# Patient Record
Sex: Female | Born: 1990 | Race: White | Hispanic: No | Marital: Single | Smoking: Never smoker
Health system: Southern US, Community
[De-identification: ages and names within clinical notes are randomized; demographics above are authoritative.]

## PROBLEM LIST (undated history)

## (undated) DIAGNOSIS — T7840XA Allergy, unspecified, initial encounter: Secondary | ICD-10-CM

## (undated) HISTORY — DX: Allergy, unspecified, initial encounter: T78.40XA

---

## 2010-01-09 ENCOUNTER — Emergency Department (HOSPITAL_COMMUNITY): Admission: EM | Admit: 2010-01-09 | Discharge: 2010-01-09 | Payer: Self-pay | Admitting: Emergency Medicine

## 2010-01-27 ENCOUNTER — Ambulatory Visit: Payer: Self-pay | Admitting: Family Medicine

## 2010-05-11 LAB — URINALYSIS, ROUTINE W REFLEX MICROSCOPIC
Bilirubin Urine: NEGATIVE
Glucose, UA: NEGATIVE mg/dL
Specific Gravity, Urine: 1.024 (ref 1.005–1.030)
pH: 6.5 (ref 5.0–8.0)

## 2010-05-11 LAB — URINE CULTURE

## 2010-05-11 LAB — URINE MICROSCOPIC-ADD ON

## 2010-05-11 LAB — POCT PREGNANCY, URINE: Preg Test, Ur: NEGATIVE

## 2013-03-04 ENCOUNTER — Emergency Department (HOSPITAL_COMMUNITY)
Admission: EM | Admit: 2013-03-04 | Discharge: 2013-03-04 | Disposition: A | Discharge status: Home / Self Care | Payer: BC Managed Care – PPO | Attending: Emergency Medicine | Admitting: Emergency Medicine

## 2013-03-04 ENCOUNTER — Emergency Department (HOSPITAL_COMMUNITY): Payer: BC Managed Care – PPO

## 2013-03-04 ENCOUNTER — Encounter (HOSPITAL_COMMUNITY): Payer: Self-pay | Admitting: Emergency Medicine

## 2013-03-04 DIAGNOSIS — N390 Urinary tract infection, site not specified: Secondary | ICD-10-CM | POA: Insufficient documentation

## 2013-03-04 DIAGNOSIS — Z3202 Encounter for pregnancy test, result negative: Secondary | ICD-10-CM | POA: Insufficient documentation

## 2013-03-04 LAB — URINALYSIS, ROUTINE W REFLEX MICROSCOPIC
Bilirubin Urine: NEGATIVE
Glucose, UA: NEGATIVE mg/dL
Ketones, ur: NEGATIVE mg/dL
Leukocytes, UA: NEGATIVE
NITRITE: POSITIVE — AB
Protein, ur: 30 mg/dL — AB
SPECIFIC GRAVITY, URINE: 1.028 (ref 1.005–1.030)
UROBILINOGEN UA: 0.2 mg/dL (ref 0.0–1.0)
pH: 6 (ref 5.0–8.0)

## 2013-03-04 LAB — URINE MICROSCOPIC-ADD ON

## 2013-03-04 LAB — COMPREHENSIVE METABOLIC PANEL
ALT: 11 U/L (ref 0–35)
AST: 15 U/L (ref 0–37)
Albumin: 2.9 g/dL — ABNORMAL LOW (ref 3.5–5.2)
Alkaline Phosphatase: 65 U/L (ref 39–117)
BUN: 12 mg/dL (ref 6–23)
CALCIUM: 8.7 mg/dL (ref 8.4–10.5)
CO2: 24 mEq/L (ref 19–32)
Chloride: 102 mEq/L (ref 96–112)
Creatinine, Ser: 0.86 mg/dL (ref 0.50–1.10)
GFR calc non Af Amer: >90 mL/min (ref >90)
GLUCOSE: 90 mg/dL (ref 70–99)
Potassium: 3.9 mEq/L (ref 3.7–5.3)
SODIUM: 141 meq/L (ref 137–147)
TOTAL PROTEIN: 8.1 g/dL (ref 6.0–8.3)
Total Bilirubin: 0.3 mg/dL (ref 0.3–1.2)

## 2013-03-04 LAB — CBC WITH DIFFERENTIAL/PLATELET
BASOS PCT: 0 % (ref 0–1)
Basophils Absolute: 0 10*3/uL (ref 0.0–0.1)
EOS ABS: 0.1 10*3/uL (ref 0.0–0.7)
EOS PCT: 1 % (ref 0–5)
HCT: 28.6 % — ABNORMAL LOW (ref 36.0–46.0)
Hemoglobin: 9.8 g/dL — ABNORMAL LOW (ref 12.0–15.0)
LYMPHS ABS: 1.8 10*3/uL (ref 0.7–4.0)
Lymphocytes Relative: 15 % (ref 12–46)
MCH: 28.4 pg (ref 26.0–34.0)
MCHC: 34.3 g/dL (ref 30.0–36.0)
MCV: 82.9 fL (ref 78.0–100.0)
Monocytes Absolute: 1.2 10*3/uL — ABNORMAL HIGH (ref 0.1–1.0)
Monocytes Relative: 10 % (ref 3–12)
Neutro Abs: 8.8 10*3/uL — ABNORMAL HIGH (ref 1.7–7.7)
Neutrophils Relative %: 74 % (ref 43–77)
PLATELETS: 346 10*3/uL (ref 150–400)
RBC: 3.45 MIL/uL — AB (ref 3.87–5.11)
RDW: 12.9 % (ref 11.5–15.5)
WBC: 11.9 10*3/uL — ABNORMAL HIGH (ref 4.0–10.5)

## 2013-03-04 LAB — POCT PREGNANCY, URINE: PREG TEST UR: NEGATIVE

## 2013-03-04 LAB — LIPASE, BLOOD: LIPASE: 18 U/L (ref 11–59)

## 2013-03-04 MED ORDER — IOHEXOL 300 MG/ML  SOLN
80.0000 mL | Freq: Once | INTRAMUSCULAR | Status: AC | PRN
  Administered 2013-03-04: 80 mL via INTRAVENOUS

## 2013-03-04 MED ORDER — MORPHINE SULFATE 4 MG/ML IJ SOLN
4.0000 mg | Freq: Once | INTRAMUSCULAR | Status: AC
  Administered 2013-03-04: 4 mg via INTRAVENOUS
  Filled 2013-03-04: qty 1

## 2013-03-04 MED ORDER — CEPHALEXIN 500 MG PO CAPS: 500.0000 mg | ORAL_CAPSULE | Freq: Four times a day (QID) | ORAL | Status: DC

## 2013-03-04 MED ORDER — DEXTROSE 5 % IV SOLN
1.0000 g | Freq: Once | INTRAVENOUS | Status: AC
  Administered 2013-03-04: 1 g via INTRAVENOUS
  Filled 2013-03-04: qty 10

## 2013-03-04 MED ORDER — IBUPROFEN 800 MG PO TABS: 800.0000 mg | ORAL_TABLET | Freq: Three times a day (TID) | ORAL | Status: DC

## 2013-03-04 MED ORDER — ONDANSETRON HCL 4 MG/2ML IJ SOLN
4.0000 mg | Freq: Once | INTRAMUSCULAR | Status: AC
  Administered 2013-03-04: 4 mg via INTRAVENOUS
  Filled 2013-03-04: qty 2

## 2013-03-04 MED ORDER — IOHEXOL 300 MG/ML  SOLN
25.0000 mL | INTRAMUSCULAR | Status: AC
  Administered 2013-03-04: 25 mL via ORAL

## 2013-03-04 NOTE — ED Notes (Signed)
C/o R sided abd pain x 10 days that has been worse over the last 2 days.  Denies nausea, vomiting, diarrhea, or urinary complaint.  Pain worse with movement.

## 2013-03-04 NOTE — Discharge Instructions (Signed)
Urinary Tract Infection  Urinary tract infections (UTIs) can develop anywhere along your urinary tract. Your urinary tract is your body's drainage system for removing wastes and extra water. Your urinary tract includes two kidneys, two ureters, a bladder, and a urethra. Your kidneys are a pair of bean-shaped organs. Each kidney is about the size of your fist. They are located below your ribs, one on each side of your spine.  CAUSES  Infections are caused by microbes, which are microscopic organisms, including fungi, viruses, and bacteria. These organisms are so small that they can only be seen through a microscope. Bacteria are the microbes that most commonly cause UTIs.  SYMPTOMS   Symptoms of UTIs may vary by age and gender of the patient and by the location of the infection. Symptoms in young women typically include a frequent and intense urge to urinate and a painful, burning feeling in the bladder or urethra during urination. Older women and men are more likely to be tired, shaky, and weak and have muscle aches and abdominal pain. A fever may mean the infection is in your kidneys. Other symptoms of a kidney infection include pain in your back or sides below the ribs, nausea, and vomiting.  DIAGNOSIS  To diagnose a UTI, your caregiver will ask you about your symptoms. Your caregiver also will ask to provide a urine sample. The urine sample will be tested for bacteria and white blood cells. White blood cells are made by your body to help fight infection.  TREATMENT   Typically, UTIs can be treated with medication. Because most UTIs are caused by a bacterial infection, they usually can be treated with the use of antibiotics. The choice of antibiotic and length of treatment depend on your symptoms and the type of bacteria causing your infection.  HOME CARE INSTRUCTIONS   If you were prescribed antibiotics, take them exactly as your caregiver instructs you. Finish the medication even if you feel better after you  have only taken some of the medication.   Drink enough water and fluids to keep your urine clear or pale yellow.   Avoid caffeine, tea, and carbonated beverages. They tend to irritate your bladder.   Empty your bladder often. Avoid holding urine for long periods of time.   Empty your bladder before and after sexual intercourse.   After a bowel movement, women should cleanse from front to back. Use each tissue only once.  SEEK MEDICAL CARE IF:    You have back pain.   You develop a fever.   Your symptoms do not begin to resolve within 3 days.  SEEK IMMEDIATE MEDICAL CARE IF:    You have severe back pain or lower abdominal pain.   You develop chills.   You have nausea or vomiting.   You have continued burning or discomfort with urination.  MAKE SURE YOU:    Understand these instructions.   Will watch your condition.   Will get help right away if you are not doing well or get worse.  Document Released: 11/24/2004 Document Revised: 08/16/2011 Document Reviewed: 03/25/2011  [employer] Patient Information 2014 [employer].

## 2013-03-04 NOTE — ED Provider Notes (Signed)
CSN: [id number]     Arrival date & time 03/04/13  0321 History   First MD Initiated Contact with Patient 03/04/13 [phone]     Chief Complaint  Patient presents with  . Abdominal Pain   (Consider location/radiation/quality/duration/timing/severity/associated sxs/prior Treatment) HPI Comments: Patient presents to the emergency department with chief complaints of abdominal pain. She states the pain is mostly located on the right side. States the pain began approximately 10 days ago, and has recently worsened over the past 2 days. She states that pain is worsened with movement, and better with rest. She denies fevers, chills, nausea, vomiting, diarrhea, constipation, dysuria, hematuria, or unusual vaginal discharge. Patient states that she is currently on her menstrual cycle. He denies any history of abdominal surgeries. She is not tried taking anything to alleviate her symptoms.  The history is provided by the patient. No language interpreter was used.    History reviewed. No pertinent past medical history. History reviewed. No pertinent past surgical history. No family history on file. History  Substance Use Topics  . Smoking status: Never Smoker   . Smokeless tobacco: Not on file  . Alcohol Use: No   OB History   Grav Para Term Preterm Abortions TAB SAB Ect Mult Living                 Review of Systems  All other systems reviewed and are negative.    Allergies  Other  Home Medications   Current Outpatient Rx  Name  Route  Sig  Dispense  Refill  . ibuprofen (ADVIL,MOTRIN) 800 MG tablet   Oral   Take 800 mg by mouth every 8 (eight) hours as needed for moderate pain.          BP 107/57  Pulse 89  Temp(Src) 97.7 F (36.5 C) (Oral)  Resp 20  SpO2 98%  LMP 02/28/2013 Physical Exam  Nursing note and vitals reviewed. Constitutional: She is oriented to person, place, and time. She appears well-developed and well-nourished.  HENT:  Head: Normocephalic and atraumatic.  Eyes:  Conjunctivae and EOM are normal. Pupils are equal, round, and reactive to light.  Neck: Normal range of motion. Neck supple.  Cardiovascular: Normal rate and regular rhythm.  Exam reveals no gallop and no friction rub.   No murmur heard. Pulmonary/Chest: Effort normal and breath sounds normal. No respiratory distress. She has no wheezes. She has no rales. She exhibits no tenderness.  Abdominal: Soft. She exhibits no distension and no mass. There is tenderness. There is no rebound and no guarding.  Patient is very tender in the right lower quadrant, she also exhibits right lower quadrant tenderness on the left lower quadrant is palpated  Musculoskeletal: Normal range of motion. She exhibits no edema and no tenderness.  Neurological: She is alert and oriented to person, place, and time.  Skin: Skin is warm and dry.  Psychiatric: She has a normal mood and affect. Her behavior is normal. Judgment and thought content normal.    ED Course  Procedures (including critical care time) Results for orders placed during the hospital encounter of 03/04/13  CBC WITH DIFFERENTIAL      Result Value Range   WBC 11.9 (*) 4.0 - 10.5 K/uL   RBC 3.45 (*) 3.87 - 5.11 MIL/uL   Hemoglobin 9.8 (*) 12.0 - 15.0 g/dL   HCT 11.9 (*) 14.7 - 82.9 %   MCV 82.9  78.0 - 100.0 fL   MCH 28.4  26.0 - 34.0 pg  MCHC 34.3  30.0 - 36.0 g/dL   RDW 16.1  09.6 - 04.5 %   Platelets 346  150 - 400 K/uL   Neutrophils Relative % 74  43 - 77 %   Neutro Abs 8.8 (*) 1.7 - 7.7 K/uL   Lymphocytes Relative 15  12 - 46 %   Lymphs Abs 1.8  0.7 - 4.0 K/uL   Monocytes Relative 10  3 - 12 %   Monocytes Absolute 1.2 (*) 0.1 - 1.0 K/uL   Eosinophils Relative 1  0 - 5 %   Eosinophils Absolute 0.1  0.0 - 0.7 K/uL   Basophils Relative 0  0 - 1 %   Basophils Absolute 0.0  0.0 - 0.1 K/uL  COMPREHENSIVE METABOLIC PANEL      Result Value Range   Sodium 141  137 - 147 mEq/L   Potassium 3.9  3.7 - 5.3 mEq/L   Chloride 102  96 - 112 mEq/L   CO2  24  19 - 32 mEq/L   Glucose, Bld 90  70 - 99 mg/dL   BUN 12  6 - 23 mg/dL   Creatinine, Ser 4.09  0.50 - 1.10 mg/dL   Calcium 8.7  8.4 - 81.1 mg/dL   Total Protein 8.1  6.0 - 8.3 g/dL   Albumin 2.9 (*) 3.5 - 5.2 g/dL   AST 15  0 - 37 U/L   ALT 11  0 - 35 U/L   Alkaline Phosphatase 65  39 - 117 U/L   Total Bilirubin 0.3  0.3 - 1.2 mg/dL   GFR calc non Af Amer >90  >90 mL/min   GFR calc Af Amer >90  >90 mL/min  LIPASE, BLOOD      Result Value Range   Lipase 18  11 - 59 U/L  URINALYSIS, ROUTINE W REFLEX MICROSCOPIC      Result Value Range   Color, Urine YELLOW  YELLOW   APPearance CLOUDY (*) CLEAR   Specific Gravity, Urine 1.028  1.005 - 1.030   pH 6.0  5.0 - 8.0   Glucose, UA NEGATIVE  NEGATIVE mg/dL   Hgb urine dipstick MODERATE (*) NEGATIVE   Bilirubin Urine NEGATIVE  NEGATIVE   Ketones, ur NEGATIVE  NEGATIVE mg/dL   Protein, ur 30 (*) NEGATIVE mg/dL   Urobilinogen, UA 0.2  0.0 - 1.0 mg/dL   Nitrite POSITIVE (*) NEGATIVE   Leukocytes, UA NEGATIVE  NEGATIVE  URINE MICROSCOPIC-ADD ON      Result Value Range   Squamous Epithelial / LPF MANY (*) RARE   WBC, UA 3-6  <3 WBC/hpf   RBC / HPF 3-6  <3 RBC/hpf   Bacteria, UA MANY (*) RARE   Casts GRANULAR CAST (*) NEGATIVE  POCT PREGNANCY, URINE      Result Value Range   Preg Test, Ur NEGATIVE  NEGATIVE   Ct Abdomen Pelvis W Contrast  03/04/2013   CLINICAL DATA:  Right lower quadrant abdominal pain and tenderness.  EXAM: CT ABDOMEN AND PELVIS WITH CONTRAST  TECHNIQUE: Multidetector CT imaging of the abdomen and pelvis was performed using the standard protocol following bolus administration of intravenous contrast.  CONTRAST:  80mL OMNIPAQUE IOHEXOL 300 MG/ML  SOLN  COMPARISON:  None.  FINDINGS: It is difficult to identify a discrete appendix due to crowding of bowel and lack of intraperitoneal fat. There is no convincing evidence by CT of acute appendicitis. There are some thickened appearing loops of jejunum in the left abdomen as well  as some potential adjacent edema in the mesenteric fat. Findings may be consistent with enteritis. There is no evidence of overt bowel obstruction or perforation. No focal abscess is identified.  In the pelvis, a small amount of free fluid is present as well as an ill-defined enhancing cystic structure in the right pelvis which likely represents an ovarian cyst. Based on morphology, this may be a collapsing/ ruptured cyst. This region measures roughly 2 cm in diameter.  The liver, spleen, gallbladder, pancreas, adrenal glands and kidneys are within normal limits. No enlarged lymph nodes are identified. No evidence of hernia. The bladder is unremarkable. No vascular abnormalities are seen. Bony structures are within normal limits.  IMPRESSION: 1. No evidence of acute appendicitis by CT. The appendix is difficult to delineate due to crowding of bowel loops related to lack of intraperitoneal fat. 2. Suggestion of thickened jejunal loops without obstruction, bowel perforation or focal abscess. Findings may be consistent with enteritis. 3. 2 cm irregular cystic area of the right pelvis which may represent a collapsing ovarian cyst.   Electronically Signed   By: Irish LackGlenn  Yamagata M.D.   On: 03/04/2013 11:46      EKG Interpretation   None       MDM   1. UTI (lower urinary tract infection)     Patient with right lower quadrant abdominal pain. The pain is severe. She denies any nausea, vomiting, diarrhea, fevers, or chills. She also denies any dysuria or hematuria. Prilosec she does have a UTI today, and I will treat her with Rocephin, however do to the right lower quadrant tenderness, which is unrelenting, and has been worsening over the past couple of days, I cannot rule out appendicitis, therefore I will order a CT abdomen pelvis for further evaluation. Patient denies pelvic pain, or any vaginal discharge, other than her menstrual cycle.  11:58 AM CT is negative. Patient continues to deny any pelvic pain.  She states she feels a little better. Will discharge with Keflex, ibuprofen. Patient is stable and ready for discharge.    Roxy Horsemanobert Adlene Adduci, PA-C 03/04/13 1158

## 2013-03-05 LAB — URINE CULTURE: Colony Count: >=100000

## 2013-03-05 NOTE — ED Provider Notes (Signed)
Medical screening examination/treatment/procedure(s) were performed by non-physician practitioner and as supervising physician I was immediately available for consultation/collaboration.  Flint MelterElliott L Hibah Odonnell, MD 03/05/13 [phone]

## 2014-07-17 ENCOUNTER — Ambulatory Visit (INDEPENDENT_AMBULATORY_CARE_PROVIDER_SITE_OTHER): Payer: BLUE CROSS/BLUE SHIELD | Admitting: Emergency Medicine

## 2014-07-17 VITALS — BP 110/60 | HR 60 | Temp 98.0°F | Resp 16 | Ht 65.0 in | Wt 116.0 lb

## 2014-07-17 DIAGNOSIS — N760 Acute vaginitis: Secondary | ICD-10-CM

## 2014-07-17 DIAGNOSIS — B353 Tinea pedis: Secondary | ICD-10-CM | POA: Diagnosis not present

## 2014-07-17 DIAGNOSIS — B9689 Other specified bacterial agents as the cause of diseases classified elsewhere: Secondary | ICD-10-CM

## 2014-07-17 DIAGNOSIS — A499 Bacterial infection, unspecified: Secondary | ICD-10-CM

## 2014-07-17 DIAGNOSIS — R300 Dysuria: Secondary | ICD-10-CM | POA: Diagnosis not present

## 2014-07-17 LAB — POCT WET PREP WITH KOH
KOH Prep POC: NEGATIVE
TRICHOMONAS UA: NEGATIVE
YEAST WET PREP PER HPF POC: NEGATIVE

## 2014-07-17 LAB — POCT UA - MICROSCOPIC ONLY
CRYSTALS, UR, HPF, POC: NEGATIVE
Casts, Ur, LPF, POC: NEGATIVE
MUCUS UA: NEGATIVE
Yeast, UA: NEGATIVE

## 2014-07-17 LAB — POCT URINALYSIS DIPSTICK
Bilirubin, UA: NEGATIVE
GLUCOSE UA: NEGATIVE
Ketones, UA: NEGATIVE
Leukocytes, UA: NEGATIVE
NITRITE UA: NEGATIVE
PROTEIN UA: 100
Spec Grav, UA: 1.02
UROBILINOGEN UA: 1
pH, UA: 7.5

## 2014-07-17 MED ORDER — TERBINAFINE HCL 250 MG PO TABS: 250.0000 mg | ORAL_TABLET | Freq: Every day | ORAL | Status: DC

## 2014-07-17 MED ORDER — METRONIDAZOLE 500 MG PO TABS: 500.0000 mg | ORAL_TABLET | Freq: Two times a day (BID) | ORAL | Status: DC

## 2014-07-17 NOTE — Progress Notes (Signed)
Subjective:  Patient ID: Katrina Hudson, female    DOB: Dec 03, 1990  Age: 24 y.o. MRN: [id number]  CC: Blister and Urine Exam   HPI Katrina SaunasDanya S Hudson presents for evaluation and treatment of a rash on her foot. She noted about 2 weeks ago rash on the medial aspect of her left foot just below the ankle. She said it began as blisters and then developed into a scaly rash that is pruritic. She now has multiple lesions on her forefoot between her toes.  She describes a discomfort in her genital region she said she had one episode of dyspareunia but now has itching and burning. She has scant vaginal discharge. Denies any dysuria, urgency or frequency. Has no fever or chills. No nausea vomiting. No change.  She denies any improvement with over-the-counter medication and has no new medical problem  History Katrina Hudson has a past medical history of Allergy.   She has no past surgical history on file.   Her family history includes Cancer in her sister; Diabetes in her brother; Hypertension in her father, mother, and sister; Stroke in her sister.She reports that she has never smoked. She does not have any smokeless tobacco history on file. She reports that she does not drink alcohol or use illicit drugs.  Outpatient Prescriptions Prior to Visit  Medication Sig Dispense Refill  . cephALEXin (KEFLEX) 500 MG capsule Take 1 capsule (500 mg total) by mouth 4 (four) times daily. (Patient not taking: Reported on 07/17/2014) 40 capsule 0  . ibuprofen (ADVIL,MOTRIN) 800 MG tablet Take 800 mg by mouth every 8 (eight) hours as needed for moderate pain.    Katrina Hudson. ibuprofen (ADVIL,MOTRIN) 800 MG tablet Take 1 tablet (800 mg total) by mouth 3 (three) times daily. (Patient not taking: Reported on 07/17/2014) 21 tablet 0   No facility-administered medications prior to visit.    ROS Review of Systems  Constitutional: Negative for fever, chills and appetite change.  HENT: Negative for congestion, ear pain, postnasal drip,  sinus pressure and sore throat.   Eyes: Negative for pain and redness.  Respiratory: Negative for cough, shortness of breath and wheezing.   Cardiovascular: Negative for leg swelling.  Gastrointestinal: Negative for nausea, vomiting, abdominal pain, diarrhea, constipation and blood in stool.  Endocrine: Negative for polyuria.  Genitourinary: Positive for dysuria and vaginal discharge. Negative for urgency, frequency and flank pain.  Musculoskeletal: Negative for gait problem.  Skin: Positive for rash.  Neurological: Negative for weakness and headaches.  Psychiatric/Behavioral: Negative for confusion and decreased concentration. The patient is not nervous/anxious.     Objective:  BP 110/60 mmHg  Pulse 60  Temp(Src) 98 F (36.7 C) (Oral)  Resp 16  Ht 5\' 5"  (1.651 m)  Wt 116 lb (52.617 kg)  BMI 19.30 kg/m2  SpO2 98%  LMP 07/09/2014  Physical Exam  Constitutional: She is oriented to person, place, and time. She appears well-developed and well-nourished. No distress.  HENT:  Head: Normocephalic and atraumatic.  Right Ear: External ear normal.  Left Ear: External ear normal.  Nose: Nose normal.  Eyes: Conjunctivae and EOM are normal. Pupils are equal, round, and reactive to light. No scleral icterus.  Neck: Normal range of motion. Neck supple. No tracheal deviation present.  Cardiovascular: Normal rate, regular rhythm and normal heart sounds.   Pulmonary/Chest: Effort normal. No respiratory distress. She has no wheezes. She has no rales.  Abdominal: She exhibits no mass. There is no tenderness. There is no rebound and no guarding.  Musculoskeletal: She exhibits no edema.  Lymphadenopathy:    She has no cervical adenopathy.  Neurological: She is alert and oriented to person, place, and time. Coordination normal.  Skin: Skin is warm and dry. Rash (on left foot suggestive of a tinea pedis) noted.  Psychiatric: She has a normal mood and affect. Her behavior is normal.       Assessment & Plan:   Craig StaggersDanya was seen today for blister and urine exam.  Diagnoses and all orders for this visit:  Tinea pedis of left foot Orders: -     POCT UA - Microscopic Only -     POCT urinalysis dipstick -     GC/Chlamydia Probe Amp -     POCT Wet Prep with KOH  Dysuria Orders: -     POCT UA - Microscopic Only -     POCT urinalysis dipstick -     GC/Chlamydia Probe Amp -     POCT Wet Prep with KOH  BV (bacterial vaginosis)  Other orders -     terbinafine (LAMISIL) 250 MG tablet; Take 1 tablet (250 mg total) by mouth daily. -     metroNIDAZOLE (FLAGYL) 500 MG tablet; Take 1 tablet (500 mg total) by mouth 2 (two) times daily with a meal. DO NOT CONSUME ALCOHOL WHILE TAKING THIS MEDICATION.   I am having Ms. Kemler start on terbinafine and metroNIDAZOLE. I am also having her maintain her ibuprofen, cephALEXin, and ibuprofen.  Meds ordered this encounter  Medications  . terbinafine (LAMISIL) 250 MG tablet    Sig: Take 1 tablet (250 mg total) by mouth daily.    Dispense:  14 tablet    Refill:  0  . metroNIDAZOLE (FLAGYL) 500 MG tablet    Sig: Take 1 tablet (500 mg total) by mouth 2 (two) times daily with a meal. DO NOT CONSUME ALCOHOL WHILE TAKING THIS MEDICATION.    Dispense:  14 tablet    Refill:  0   Results for orders placed or performed in visit on 07/17/14  POCT UA - Microscopic Only  Result Value Ref Range   WBC, Ur, HPF, POC 0-4    RBC, urine, microscopic 0-2    Bacteria, U Microscopic mod    Mucus, UA neg    Epithelial cells, urine per micros 0-3    Crystals, Ur, HPF, POC neg    Casts, Ur, LPF, POC neg    Yeast, UA neg   POCT urinalysis dipstick  Result Value Ref Range   Color, UA yellow    Clarity, UA clear    Glucose, UA neg    Bilirubin, UA neg    Ketones, UA neg    Spec Grav, UA 1.020    Blood, UA trace-intact    pH, UA 7.5    Protein, UA 100    Urobilinogen, UA 1.0    Nitrite, UA neg    Leukocytes, UA Negative   POCT Wet Prep  with KOH  Result Value Ref Range   Trichomonas, UA Negative    Clue Cells Wet Prep HPF POC 2-6    Epithelial Wet Prep HPF POC 0-2    Yeast Wet Prep HPF POC neg    Bacteria Wet Prep HPF POC mod    RBC Wet Prep HPF POC 0-1    WBC Wet Prep HPF POC 0-3    KOH Prep POC Negative      Follow-up: Return in about 2 weeks (around 07/31/2014).  Carmelina DaneAnderson, Kresta Templeman S, MD

## 2014-07-17 NOTE — Patient Instructions (Signed)

## 2014-07-19 LAB — GC/CHLAMYDIA PROBE AMP
CT Probe RNA: NEGATIVE
GC Probe RNA: NEGATIVE

## 2014-12-22 ENCOUNTER — Emergency Department (HOSPITAL_COMMUNITY)
Admission: EM | Admit: 2014-12-22 | Discharge: 2014-12-22 | Disposition: A | Discharge status: Home / Self Care | Payer: Self-pay | Attending: Emergency Medicine | Admitting: Emergency Medicine

## 2014-12-22 ENCOUNTER — Encounter (HOSPITAL_COMMUNITY): Payer: Self-pay | Admitting: Cardiology

## 2014-12-22 ENCOUNTER — Emergency Department (HOSPITAL_COMMUNITY): Payer: Self-pay

## 2014-12-22 DIAGNOSIS — R109 Unspecified abdominal pain: Secondary | ICD-10-CM

## 2014-12-22 DIAGNOSIS — Z3202 Encounter for pregnancy test, result negative: Secondary | ICD-10-CM | POA: Insufficient documentation

## 2014-12-22 DIAGNOSIS — N200 Calculus of kidney: Secondary | ICD-10-CM | POA: Insufficient documentation

## 2014-12-22 LAB — URINE MICROSCOPIC-ADD ON

## 2014-12-22 LAB — URINALYSIS, ROUTINE W REFLEX MICROSCOPIC
BILIRUBIN URINE: NEGATIVE
Glucose, UA: NEGATIVE mg/dL
KETONES UR: 15 mg/dL — AB
NITRITE: NEGATIVE
Protein, ur: NEGATIVE mg/dL
SPECIFIC GRAVITY, URINE: 1.025 (ref 1.005–1.030)
UROBILINOGEN UA: 0.2 mg/dL (ref 0.0–1.0)
pH: 7.5 (ref 5.0–8.0)

## 2014-12-22 LAB — COMPREHENSIVE METABOLIC PANEL
ALT: 9 U/L — ABNORMAL LOW (ref 14–54)
ANION GAP: 10 (ref 5–15)
AST: 25 U/L (ref 15–41)
Albumin: 4 g/dL (ref 3.5–5.0)
Alkaline Phosphatase: 42 U/L (ref 38–126)
BILIRUBIN TOTAL: 0.6 mg/dL (ref 0.3–1.2)
BUN: 10 mg/dL (ref 6–20)
CHLORIDE: 107 mmol/L (ref 101–111)
CO2: 23 mmol/L (ref 22–32)
Calcium: 9.5 mg/dL (ref 8.9–10.3)
Creatinine, Ser: 0.83 mg/dL (ref 0.44–1.00)
Glucose, Bld: 134 mg/dL — ABNORMAL HIGH (ref 65–99)
POTASSIUM: 3.3 mmol/L — AB (ref 3.5–5.1)
Sodium: 140 mmol/L (ref 135–145)
TOTAL PROTEIN: 7.1 g/dL (ref 6.5–8.1)

## 2014-12-22 LAB — CBC
HEMATOCRIT: 35.1 % — AB (ref 36.0–46.0)
HEMOGLOBIN: 12 g/dL (ref 12.0–15.0)
MCH: 30.2 pg (ref 26.0–34.0)
MCHC: 34.2 g/dL (ref 30.0–36.0)
MCV: 88.4 fL (ref 78.0–100.0)
Platelets: 251 10*3/uL (ref 150–400)
RBC: 3.97 MIL/uL (ref 3.87–5.11)
RDW: 12.1 % (ref 11.5–15.5)
WBC: 5 10*3/uL (ref 4.0–10.5)

## 2014-12-22 LAB — LIPASE, BLOOD: LIPASE: 19 U/L (ref 11–51)

## 2014-12-22 LAB — POC URINE PREG, ED: PREG TEST UR: NEGATIVE

## 2014-12-22 MED ORDER — KETOROLAC TROMETHAMINE 30 MG/ML IJ SOLN
30.0000 mg | Freq: Once | INTRAMUSCULAR | Status: AC
  Administered 2014-12-22: 30 mg via INTRAVENOUS
  Filled 2014-12-22: qty 1

## 2014-12-22 MED ORDER — ONDANSETRON HCL 4 MG/2ML IJ SOLN
4.0000 mg | Freq: Once | INTRAMUSCULAR | Status: AC
  Administered 2014-12-22: 4 mg via INTRAVENOUS
  Filled 2014-12-22: qty 2

## 2014-12-22 MED ORDER — OXYCODONE-ACETAMINOPHEN 5-325 MG PO TABS
1.0000 | ORAL_TABLET | Freq: Once | ORAL | Status: AC
  Administered 2014-12-22: 1 via ORAL

## 2014-12-22 MED ORDER — ONDANSETRON HCL 4 MG PO TABS
4.0000 mg | ORAL_TABLET | Freq: Three times a day (TID) | ORAL | Status: AC | PRN
Start: 2014-12-22 — End: ?

## 2014-12-22 MED ORDER — IBUPROFEN 800 MG PO TABS: 800.0000 mg | ORAL_TABLET | Freq: Three times a day (TID) | ORAL | Status: DC

## 2014-12-22 MED ORDER — OXYCODONE-ACETAMINOPHEN 5-325 MG PO TABS
1.0000 | ORAL_TABLET | Freq: Once | ORAL | Status: AC
  Administered 2014-12-22: 1 via ORAL
  Filled 2014-12-22: qty 1

## 2014-12-22 MED ORDER — OXYCODONE-ACETAMINOPHEN 5-325 MG PO TABS: 1.0000 | ORAL_TABLET | Freq: Three times a day (TID) | ORAL | Status: AC | PRN

## 2014-12-22 MED ORDER — OXYCODONE-ACETAMINOPHEN 5-325 MG PO TABS
ORAL_TABLET | ORAL | Status: AC
  Filled 2014-12-22: qty 1

## 2014-12-22 MED ORDER — TAMSULOSIN HCL 0.4 MG PO CAPS: 0.4000 mg | ORAL_CAPSULE | Freq: Every day | ORAL | Status: AC

## 2014-12-22 NOTE — ED Notes (Signed)
PA Rose made aware of patient's pain, advised she is on her way in to assess patient.

## 2014-12-22 NOTE — ED Notes (Signed)
Reports right side flank pain that started this morning whe she woke up. Reports it feels like a kidney stone.

## 2014-12-22 NOTE — ED Provider Notes (Signed)
CSN: [id number]     Arrival date & time 12/22/14  1105 History   First MD Initiated Contact with Patient 12/22/14 1211     Chief Complaint  Patient presents with  . Flank Pain     (Consider location/radiation/quality/duration/timing/severity/associated sxs/prior Treatment) HPI Katrina Hudson is a 24 y.o. female with PMH significant for kidney stones who presents with sudden constant, sharp right flank pain this morning.  She states this feels similar to when she had a kidney stone in the past.  Nothing makes it better or worse.  Endorses chills, N/V.  Denies fevers, CP, SOB, abdominal pain, hematuria, or urinary symptoms.  Patient was given percocet in triage, but immediately vomited.  Past Medical History  Diagnosis Date  . Allergy    History reviewed. No pertinent past surgical history. Family History  Problem Relation Age of Onset  . Hypertension Mother   . Hypertension Father   . Hypertension Sister   . Diabetes Brother   . Cancer Sister     Leukemia  . Stroke Sister    Social History  Substance Use Topics  . Smoking status: Never Smoker   . Smokeless tobacco: None  . Alcohol Use: No   OB History    No data available     Review of Systems All other systems negative unless otherwise stated in HPI    Allergies  Other  Home Medications   Prior to Admission medications   Medication Sig Start Date End Date Taking? Authorizing Provider  ibuprofen (ADVIL,MOTRIN) 800 MG tablet Take 1 tablet (800 mg total) by mouth 3 (three) times daily. 12/22/14   Carlis Blanchard, PA-C  ondansetron (ZOFRAN) 4 MG tablet Take 1 tablet (4 mg total) by mouth every 8 (eight) hours as needed for nausea or vomiting. 12/22/14   Cheri FowlerKayla Journey [place], PA-C  oxyCODONE-acetaminophen (PERCOCET/ROXICET) 5-325 MG tablet Take 1 tablet by mouth every 8 (eight) hours as needed for severe pain. 12/22/14   Cheri FowlerKayla Ashari Llewellyn, PA-C  tamsulosin (FLOMAX) 0.4 MG CAPS capsule Take 1 capsule (0.4 mg total) by mouth daily. Take one  capsule (0.4 mg total) by mouth daily for 14 days or until stone passes. 12/22/14   Joyceline Maiorino, PA-C   BP 109/61 mmHg  Pulse 75  Temp(Src) 98.2 F (36.8 C) (Oral)  Resp 16  Wt 116 lb (52.617 kg)  SpO2 98%  LMP 12/21/2014 Physical Exam  Constitutional: She is oriented to person, place, and time. She appears well-developed and well-nourished. She appears ill. She appears distressed.  HENT:  Head: Normocephalic and atraumatic.  Mouth/Throat: Oropharynx is clear and moist.  Eyes: Conjunctivae are normal. Pupils are equal, round, and reactive to light.  Neck: Normal range of motion. Neck supple.  Cardiovascular: Normal rate, regular rhythm and normal heart sounds.   No murmur heard. Pulmonary/Chest: Effort normal and breath sounds normal. No accessory muscle usage or stridor. No respiratory distress. She has no wheezes. She has no rhonchi. She has no rales.  Abdominal: Soft. Bowel sounds are normal. She exhibits no distension. There is tenderness. There is no rigidity, no guarding and no CVA tenderness.  Musculoskeletal: Normal range of motion.  Lymphadenopathy:    She has no cervical adenopathy.  Neurological: She is alert and oriented to person, place, and time.  Speech clear without dysarthria.  Skin: Skin is warm and dry.  Psychiatric: She has a normal mood and affect. Her behavior is normal.    ED Course  Procedures (including critical care time) Labs Review Labs  Reviewed  COMPREHENSIVE METABOLIC PANEL - Abnormal; Notable for the following:    Potassium 3.3 (*)    Glucose, Bld 134 (*)    ALT 9 (*)    All other components within normal limits  CBC - Abnormal; Notable for the following:    HCT 35.1 (*)    All other components within normal limits  URINALYSIS, ROUTINE W REFLEX MICROSCOPIC (NOT AT [facility]) - Abnormal; Notable for the following:    APPearance TURBID (*)    Hgb urine dipstick LARGE (*)    Ketones, ur 15 (*)    Leukocytes, UA TRACE (*)    All other components  within normal limits  URINE MICROSCOPIC-ADD ON - Abnormal; Notable for the following:    Squamous Epithelial / LPF FEW (*)    Bacteria, UA FEW (*)    All other components within normal limits  URINE CULTURE  LIPASE, BLOOD  POC URINE PREG, ED    Imaging Review Ct Renal Stone Study  12/22/2014  CLINICAL DATA:  Right flank pain EXAM: CT ABDOMEN AND PELVIS WITHOUT CONTRAST TECHNIQUE: Multidetector CT imaging of the abdomen and pelvis was performed following the standard protocol without IV contrast. COMPARISON:  None. FINDINGS: Lower chest:  No acute findings. Hepatobiliary: No mass visualized on this un-enhanced exam. Pancreas: No mass or inflammatory process identified on this un-enhanced exam. Spleen: Within normal limits in size. Adrenals/Urinary Tract: Scattered small stones noted within the right renal pelvis, at least 5, largest measuring 4 mm. 3 mm stone noted within the distal right ureter causing moderate hydronephrosis. Vague hyperdense foci are seen about the left renal pelvis suggesting additional small nonobstructing renal stones versus medullary nephrocalcinosis. No left-sided hydronephrosis. Bladder is decompressed. Stomach/Bowel: No evidence of obstruction, inflammatory process, or abnormal fluid collections. Vascular/Lymphatic: No pathologically enlarged lymph nodes. No evidence of abdominal aortic aneurysm. Reproductive: No mass or other significant abnormality. Other: Small amount of free fluid in the lower pelvis is likely physiologic in nature. Musculoskeletal:  No suspicious bone lesions identified. IMPRESSION: 3 mm stone within the distal right ureter, just proximal to the right UVJ, causing moderate hydronephrosis. Bilateral nephrolithiasis. 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 Electronically Signed   By: Bary Richard M.D.   On: 12/22/2014 14:53   I have personally reviewed and evaluated these images and lab results as part of my medical decision-making.   EKG  Interpretation None      MDM   Final diagnoses:  Right flank pain  Nephrolithiasis    Patient presents with right flank pain and history of kidney stones.  VSS, distressed, and writhing around in pain.  On exam, generalized abdominal tenderness without rebound or guarding.  Lungs CTAB, heart RRR.  Will obtain lipase, CMP, CBC, UA, and renal stone study. Will give toradol. Upon reassessment, pain is more controlled.  Will give PO percocet.  Lipase, CMP, CBC unremarkable. UA shows large hematuria.  Will obtain urine culture. CT renal stone study shows 3 mm stone within right distal ureter, proximal to right UVJ causing moderate hydronephrosis.   Patient stable for discharge.  Discussed return precautions.  Follow up urology in 2 days.  Patient agrees and acknowledges the above plan for discharge.  Case has been discussed with and seen by Dr. Madilyn Hook who agrees with the above plan for discharge.     [name], PA-C 12/22/14 1513  Tilden Fossa, MD 12/23/14 [phone]

## 2014-12-22 NOTE — Discharge Instructions (Signed)
Kidney Stones °Kidney stones (urolithiasis) are deposits that form inside your kidneys. The intense pain is caused by the stone moving through the urinary tract. When the stone moves, the ureter goes into spasm around the stone. The stone is usually passed in the urine.  °CAUSES  °· A disorder that makes certain neck glands produce too much parathyroid hormone (primary hyperparathyroidism). °· A buildup of uric acid crystals, similar to gout in your joints. °· Narrowing (stricture) of the ureter. °· A kidney obstruction present at birth (congenital obstruction). °· Previous surgery on the kidney or ureters. °· Numerous kidney infections. °SYMPTOMS  °· Feeling sick to your stomach (nauseous). °· Throwing up (vomiting). °· Blood in the urine (hematuria). °· Pain that usually spreads (radiates) to the groin. °· Frequency or urgency of urination. °DIAGNOSIS  °· Taking a history and physical exam. °· Blood or urine tests. °· CT scan. °· Occasionally, an examination of the inside of the urinary bladder (cystoscopy) is performed. °TREATMENT  °· Observation. °· Increasing your fluid intake. °· Extracorporeal shock wave lithotripsy--This is a noninvasive procedure that uses shock waves to break up kidney stones. °· Surgery may be needed if you have severe pain or persistent obstruction. There are various surgical procedures. Most of the procedures are performed with the use of small instruments. Only small incisions are needed to accommodate these instruments, so recovery time is minimized. °The size, location, and chemical composition are all important variables that will determine the proper choice of action for you. Talk to your health care provider to better understand your situation so that you will minimize the risk of injury to yourself and your kidney.  °HOME CARE INSTRUCTIONS  °· Drink enough water and fluids to keep your urine clear or pale yellow. This will help you to pass the stone or stone fragments. °· Strain  all urine through the provided strainer. Keep all particulate matter and stones for your health care provider to see. The stone causing the pain may be as small as a grain of salt. It is very important to use the strainer each and every time you pass your urine. The collection of your stone will allow your health care provider to analyze it and verify that a stone has actually passed. The stone analysis will often identify what you can do to reduce the incidence of recurrences. °· Only take over-the-counter or prescription medicines for pain, discomfort, or fever as directed by your health care provider. °· Keep all follow-up visits as told by your health care provider. This is important. °· Get follow-up X-rays if required. The absence of pain does not always mean that the stone has passed. It may have only stopped moving. If the urine remains completely obstructed, it can cause loss of kidney function or even complete destruction of the kidney. It is your responsibility to make sure X-rays and follow-ups are completed. Ultrasounds of the kidney can show blockages and the status of the kidney. Ultrasounds are not associated with any radiation and can be performed easily in a matter of minutes. °· Make changes to your daily diet as told by your health care provider. You may be told to: °¨ Limit the amount of salt that you eat. °¨ Eat 5 or more servings of fruits and vegetables each day. °¨ Limit the amount of meat, poultry, fish, and eggs that you eat. °· Collect a 24-hour urine sample as told by your health care provider. You may need to collect another urine sample every 6-12   months. °SEEK MEDICAL CARE IF: °· You experience pain that is progressive and unresponsive to any pain medicine you have been prescribed. °SEEK IMMEDIATE MEDICAL CARE IF:  °· Pain cannot be controlled with the prescribed medicine. °· You have a fever or shaking chills. °· The severity or intensity of pain increases over 18 hours and is not  relieved by pain medicine. °· You develop a new onset of abdominal pain. °· You feel faint or pass out. °· You are unable to urinate. °  °This information is not intended to replace advice given to you by your health care provider. Make sure you discuss any questions you have with your health care provider. °  °Document Released: 02/14/2005 Document Revised: 11/05/2014 Document Reviewed: 07/18/2012 °Elsevier Interactive Patient Education ©2016 [employer] ° °

## 2014-12-22 NOTE — ED Notes (Signed)
CT aware of negative preg test

## 2014-12-22 NOTE — ED Notes (Signed)
PA Rose made aware that patient is vomiting

## 2014-12-22 NOTE — ED Notes (Signed)
PA Rose at bedside 

## 2014-12-24 LAB — URINE CULTURE

## 2016-02-21 ENCOUNTER — Encounter (HOSPITAL_COMMUNITY): Payer: Self-pay

## 2016-02-21 ENCOUNTER — Emergency Department (HOSPITAL_COMMUNITY)
Admission: EM | Admit: 2016-02-21 | Discharge: 2016-02-21 | Disposition: A | Discharge status: Home / Self Care | Payer: No Typology Code available for payment source | Attending: Emergency Medicine | Admitting: Emergency Medicine

## 2016-02-21 DIAGNOSIS — M62830 Muscle spasm of back: Secondary | ICD-10-CM | POA: Diagnosis not present

## 2016-02-21 DIAGNOSIS — S8011XA Contusion of right lower leg, initial encounter: Secondary | ICD-10-CM

## 2016-02-21 DIAGNOSIS — Y999 Unspecified external cause status: Secondary | ICD-10-CM | POA: Insufficient documentation

## 2016-02-21 DIAGNOSIS — Y939 Activity, unspecified: Secondary | ICD-10-CM | POA: Diagnosis not present

## 2016-02-21 DIAGNOSIS — Y92410 Unspecified street and highway as the place of occurrence of the external cause: Secondary | ICD-10-CM | POA: Insufficient documentation

## 2016-02-21 MED ORDER — CYCLOBENZAPRINE HCL 5 MG PO TABS: 5.0000 mg | ORAL_TABLET | Freq: Two times a day (BID) | ORAL | 0 refills | Status: DC | PRN

## 2016-02-21 MED ORDER — IBUPROFEN 600 MG PO TABS: 600.0000 mg | ORAL_TABLET | Freq: Four times a day (QID) | ORAL | 0 refills | Status: DC | PRN

## 2016-02-21 MED ORDER — CYCLOBENZAPRINE HCL 10 MG PO TABS
5.0000 mg | ORAL_TABLET | Freq: Once | ORAL | Status: AC
  Administered 2016-02-21: 5 mg via ORAL
  Filled 2016-02-21: qty 1

## 2016-02-21 MED ORDER — IBUPROFEN 400 MG PO TABS
600.0000 mg | ORAL_TABLET | Freq: Once | ORAL | Status: AC
  Administered 2016-02-21: 600 mg via ORAL
  Filled 2016-02-21: qty 1
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
## 2016-02-21 NOTE — Discharge Instructions (Signed)
Take motrin every 6 hrs for 2 days then as needed.   Take flexeril for muscle spasms   Rest for 2-3 days. No heavy lifting   See your doctor  Return to ER if you have severe pain, unable to walk, weakness, numbness

## 2016-12-10 ENCOUNTER — Emergency Department (HOSPITAL_COMMUNITY)
Admission: EM | Admit: 2016-12-10 | Discharge: 2016-12-10 | Disposition: A | Discharge status: Home / Self Care | Payer: Self-pay | Attending: Emergency Medicine | Admitting: Emergency Medicine

## 2016-12-10 ENCOUNTER — Encounter (HOSPITAL_COMMUNITY): Payer: Self-pay | Admitting: Emergency Medicine

## 2016-12-10 ENCOUNTER — Emergency Department (HOSPITAL_COMMUNITY): Payer: Self-pay

## 2016-12-10 DIAGNOSIS — M25561 Pain in right knee: Secondary | ICD-10-CM | POA: Insufficient documentation

## 2016-12-10 DIAGNOSIS — Y92410 Unspecified street and highway as the place of occurrence of the external cause: Secondary | ICD-10-CM | POA: Insufficient documentation

## 2016-12-10 DIAGNOSIS — Z79899 Other long term (current) drug therapy: Secondary | ICD-10-CM | POA: Insufficient documentation

## 2016-12-10 DIAGNOSIS — Y939 Activity, unspecified: Secondary | ICD-10-CM | POA: Insufficient documentation

## 2016-12-10 DIAGNOSIS — R109 Unspecified abdominal pain: Secondary | ICD-10-CM | POA: Insufficient documentation

## 2016-12-10 DIAGNOSIS — M545 Low back pain, unspecified: Secondary | ICD-10-CM

## 2016-12-10 DIAGNOSIS — S8001XA Contusion of right knee, initial encounter: Secondary | ICD-10-CM | POA: Insufficient documentation

## 2016-12-10 DIAGNOSIS — Y999 Unspecified external cause status: Secondary | ICD-10-CM | POA: Insufficient documentation

## 2016-12-10 MED ORDER — CYCLOBENZAPRINE HCL 10 MG PO TABS: 10.0000 mg | ORAL_TABLET | Freq: Two times a day (BID) | ORAL | 0 refills | Status: AC | PRN

## 2016-12-10 MED ORDER — CYCLOBENZAPRINE HCL 5 MG PO TABS: 5.0000 mg | ORAL_TABLET | Freq: Two times a day (BID) | ORAL | 0 refills | Status: AC | PRN

## 2016-12-10 MED ORDER — DICLOFENAC SODIUM 50 MG PO TBEC: 50.0000 mg | DELAYED_RELEASE_TABLET | Freq: Two times a day (BID) | ORAL | 0 refills | Status: AC

## 2016-12-10 MED ORDER — CYCLOBENZAPRINE HCL 10 MG PO TABS
10.0000 mg | ORAL_TABLET | Freq: Once | ORAL | Status: AC
  Administered 2016-12-10: 10 mg via ORAL
  Filled 2016-12-10: qty 1

## 2016-12-10 NOTE — Discharge Instructions (Signed)
Do not take the muscle relaxant if driving as it will make you sleepy. Return here for worsening symptoms.
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
## 2016-12-10 NOTE — ED Notes (Signed)
Bed: WTR9 Expected date:  Expected time:  Means of arrival:  Comments: 
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
## 2019-09-12 ENCOUNTER — Other Ambulatory Visit: Payer: Self-pay | Admitting: Physician Assistant

## 2019-09-12 DIAGNOSIS — N200 Calculus of kidney: Secondary | ICD-10-CM
